# Patient Record
Sex: Male | Born: 1975 | Race: White | Hispanic: No | Marital: Married | Smoking: Never smoker
Health system: Southern US, Community
[De-identification: ages and names within clinical notes are randomized; demographics above are authoritative.]

## PROBLEM LIST (undated history)

## (undated) DIAGNOSIS — I319 Disease of pericardium, unspecified: Secondary | ICD-10-CM

---

## 2018-10-18 ENCOUNTER — Other Ambulatory Visit: Payer: Self-pay

## 2018-10-18 ENCOUNTER — Emergency Department
Admission: EM | Admit: 2018-10-18 | Discharge: 2018-10-18 | Disposition: A | Discharge status: Home / Self Care | Payer: BLUE CROSS/BLUE SHIELD | Source: Home / Self Care | Attending: Emergency Medicine | Admitting: Emergency Medicine

## 2018-10-18 ENCOUNTER — Emergency Department (INDEPENDENT_AMBULATORY_CARE_PROVIDER_SITE_OTHER): Payer: BLUE CROSS/BLUE SHIELD

## 2018-10-18 DIAGNOSIS — R0789 Other chest pain: Secondary | ICD-10-CM

## 2018-10-18 DIAGNOSIS — Z23 Encounter for immunization: Secondary | ICD-10-CM

## 2018-10-18 HISTORY — DX: Disease of pericardium, unspecified: I31.9

## 2018-10-18 MED ORDER — IBUPROFEN 200 MG PO TABS: ORAL_TABLET | ORAL | 0 refills | Status: AC

## 2018-10-18 MED ORDER — CYCLOBENZAPRINE HCL 10 MG PO TABS: 10.0000 mg | ORAL_TABLET | Freq: Every day | ORAL | 0 refills | Status: AC

## 2018-10-18 MED ORDER — INFLUENZA VAC SPLIT QUAD 0.5 ML IM SUSY
0.5000 mL | PREFILLED_SYRINGE | INTRAMUSCULAR | Status: AC
  Administered 2018-10-18: 0.5 mL via INTRAMUSCULAR

## 2018-10-18 NOTE — ED Provider Notes (Signed)
[facility]    CSN: [id number] Arrival date & time: 10/18/18  1301     History   Chief Complaint Chief Complaint  Patient presents with  . Chest Pain    HPI Marcus Norman is a 43 y.o. male.   HPI Recurrent left anterior chest discomfort, going on for the past 2 weeks, but might be months and he is not certain, so but he is not specific about duration. He does not rate as pain but stated "it's not pain is just something different and sore like from a heavy chest workout lifting weights at the gym".  After further questioning, he states pain is 1 out of 10, can increase to 2 out of 10. He has a PCP Dr. Nehemiah Settle at [facility], last seen there about 1 year ago.  Past medical history of pericarditis, diagnosed and treated when he was living in [place] in 2014.-He states that his symptoms were right anterior chest pain at that time. He states he was advised by his PCP that he might have residual referred pain from pericarditis.  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  For the past few days, he has had trouble sleeping due to discomfort in chest, 4 or 5 out of 10, which sometimes radiates to upper back.-When he runs, the pain tends to decrease. He has not taken any OTC meds. He states his last marathon was September 2019. Even in the winter months, he runs 5 miles 3 times a week, and he states there is no change in the left anterior just chest discomfort when he is at rest, during the run, or afterward. Associated symptoms: Denies any associated palpitations or shortness of breath.  No syncope or lightheadedness or seizures. Denies fever or chills or nausea or vomiting. Not associated with meals.  No associated epigastric pain.  No abdominal or flank pain or GU symptoms.  Past Medical History:  Diagnosis Date  . Pericarditis   Pericarditis 2014 when he was living in [place] Denies history of  diabetes.  Denies diagnosis of hypertension although occasionally BP is borderline elevated he states. There are no active problems to display for this patient.   History reviewed. No pertinent surgical history.    Home Medications -none    Family History History reviewed. No pertinent family history. Family history positive from one grandparent with diagnosis of CAD age 29.  Social History Social History   Tobacco Use  . Smoking status: Never Smoker  . Smokeless tobacco: Never Used  Substance Use Topics  . Alcohol use: Yes    Comment: occasional   . Drug use: Never   Denies ever using tobacco.  Does not use drugs.  Drinks alcohol occasionally, not to excess  Allergies   Penicillins   Review of Systems Review of Systems  All other systems reviewed and are negative.  Pertinent items noted in HPI and remainder of comprehensive ROS otherwise negative.   Physical Exam Triage Vital Signs ED Triage Vitals  Enc Vitals Group     BP      Pulse      Resp      Temp      Temp src      SpO2      Weight      Height      Head Circumference      Peak Flow      Pain Score  Pain Loc      Pain Edu?      Excl. in GC?    No data found.  Updated Vital Signs BP (!) 142/93 (BP Location: Right Arm)   Pulse 63   Temp 97.7 F (36.5 C) (Oral)   Resp 18   Ht 6' (1.829 m)   Wt 98.6 kg   SpO2 98%   BMI 29.48 kg/m    Physical Exam Vitals signs reviewed.  Constitutional:      General: He is not in acute distress.    Appearance: He is well-developed. He is not diaphoretic.  HENT:     Head: Normocephalic and atraumatic.  Eyes:     Extraocular Movements: Extraocular movements intact.     Pupils: Pupils are equal, round, and reactive to light.  Neck:     Musculoskeletal: Normal range of motion.     Comments: Nontender Cardiovascular:     Rate and Rhythm: Normal rate and regular rhythm.     Pulses:          Carotid pulses are 1+ on the right side and 1+ on the  left side.    Heart sounds: Normal heart sounds. No murmur. No friction rub. No gallop. No S3 or S4 sounds.   Pulmonary:     Effort: Pulmonary effort is normal. No respiratory distress.     Breath sounds: Normal breath sounds. No stridor. No decreased breath sounds, wheezing, rhonchi or rales.  Chest:     Chest [place]: Tenderness present.    Abdominal:     General: Abdomen is flat.     Palpations: Abdomen is soft.     Tenderness: There is no abdominal tenderness. There is no guarding.     Comments: No hepatosplenomegaly  Musculoskeletal:     Right shoulder: He exhibits normal range of motion, no tenderness and no bony tenderness.     Left shoulder: He exhibits normal range of motion, no tenderness and no bony tenderness.     Right lower leg: He exhibits no tenderness. No edema.     Left lower leg: He exhibits no tenderness. No edema.     Comments: Lower extremities: No cyanosis clubbing or edema or tenderness  Skin:    General: Skin is warm and dry.     Capillary Refill: Capillary refill takes less than 2 seconds.  Neurological:     General: No focal deficit present.     Mental Status: He is alert.     Cranial Nerves: Cranial nerves are intact. No cranial nerve deficit.     Sensory: Sensation is intact.     Motor: Motor function is intact.     Coordination: Coordination is intact.     Gait: Gait is intact.  Psychiatric:        Mood and Affect: Mood normal.        Behavior: Behavior normal.      UC Treatments / Results  Labs (all labs ordered are listed, but only abnormal results are displayed) Labs Reviewed - No data to display   EKG today: Rate 59.  Sinus rhythm.  Within normal limits.  No ischemic abnormality seen.  Radiology Dg Chest 2 View  Result Date: 10/18/2018 CLINICAL DATA:  Chest pain for few days. EXAM: CHEST - 2 VIEW COMPARISON:  None. FINDINGS: Cardiomediastinal silhouette is normal. Mediastinal contours appear intact. There is no evidence of focal airspace  consolidation, pleural effusion or pneumothorax. Osseous structures are without acute abnormality. Soft tissues are grossly normal.  IMPRESSION: No active cardiopulmonary disease. Electronically Signed   By: Ted Mcalpineobrinka  Dimitrova M.D.   On: 10/18/2018 15:03    Procedures Procedures (including critical care time)  Medications Ordered in UC Medications  Influenza vac split quadrivalent PF (FLUARIX) injection 0.5 mL (0.5 mLs Intramuscular Given 10/18/18 1538)    Initial Impression / Assessment and Plan / UC Course  I have reviewed the triage vital signs and the nursing notes.  Pertinent labs & imaging results that were available during my care of the patient were reviewed by me and considered in my medical decision making (see chart for details).    Reviewed with patient at length, explained EKG and chest x-ray today within normal limits.  No evidence of acute cardiorespiratory event  Final Clinical Impressions(s) / UC Diagnoses   Final diagnoses:  Chest pain, atypical  Atypical chest pain  Chest [place] pain     Discharge Instructions     Your EKG is normal today Chest x-ray today is normal. You likely have muscular chest [place] pain. Please read attached instruction sheet on chest [place] pain. Treatment is relative rest and staying away from heavy upper body exercising. Heat. OTC ibuprofen up to 600 mg 3 times a day with food. Printed prescription given for muscle relaxant, Flexeril, take 1 at bedtime for muscle relaxant. Today, please call Dr. Steva ColderPolite's's office to schedule a follow-up appointment in 1 week. If any severe worsening symptoms in the meantime, go to emergency room.    ED Prescriptions    Medication Sig Dispense Auth. Provider   ibuprofen (ADVIL,MOTRIN) 200 MG tablet Take three tablets ( 600 milligrams total) every 6 with food as needed for pain. 30 tablet Lajean ManesMassey, David, MD   cyclobenzaprine (FLEXERIL) 10 MG tablet Take 1 tablet (10 mg total) by mouth at bedtime. For  muscle relaxant 10 tablet Lajean ManesMassey, David, MD     Over 45 minutes spent, greater than 50% of the time spent for counseling and coordination of care.  At the end of visit, patient requested flu vaccine, so I ordered flu vaccine.  Nurse will administer flu vaccine now.   Lajean ManesMassey, David, MD 10/19/18 1536

## 2018-10-18 NOTE — ED Triage Notes (Addendum)
Pt presents to clinic with recurrent chest pain. He does not rate as pain but stated "it's not pain is just something different and sore like from a heavy chest workout". He has a PCP Dr. Nehemiah Settle and also has hx of pericarditis. Per PCP pt may have residual referred pain from pericarditis. 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 As of Saturday he had trouble sleeping due to discomfort in chest which sometimes radiates to upper back. He has not taken any OTC meds.

## 2018-10-18 NOTE — Discharge Instructions (Addendum)
Your EKG is normal today Chest x-ray today is normal. You likely have muscular chest [place] pain. Please read attached instruction sheet on chest [place] pain. Treatment is relative rest and staying away from heavy upper body exercising. Heat. OTC ibuprofen up to 600 mg 3 times a day with food. Printed prescription given for muscle relaxant, Flexeril, take 1 at bedtime for muscle relaxant. Today, please call Dr. Steva Colder office to schedule a follow-up appointment in 1 week. If any severe worsening symptoms in the meantime, go to emergency room.

## 2018-10-19 ENCOUNTER — Telehealth: Payer: Self-pay

## 2018-10-19 NOTE — Telephone Encounter (Signed)
Pt states he has improved since UC visit. Advised to call back with questions.

## 2019-09-01 IMAGING — DX DG CHEST 2V
2 series · 2 of 2 positions shown · non-contrast
Comparison: None.

CLINICAL DATA: Chest pain for few days.

EXAM:
CHEST - 2 VIEW

[chest pa]
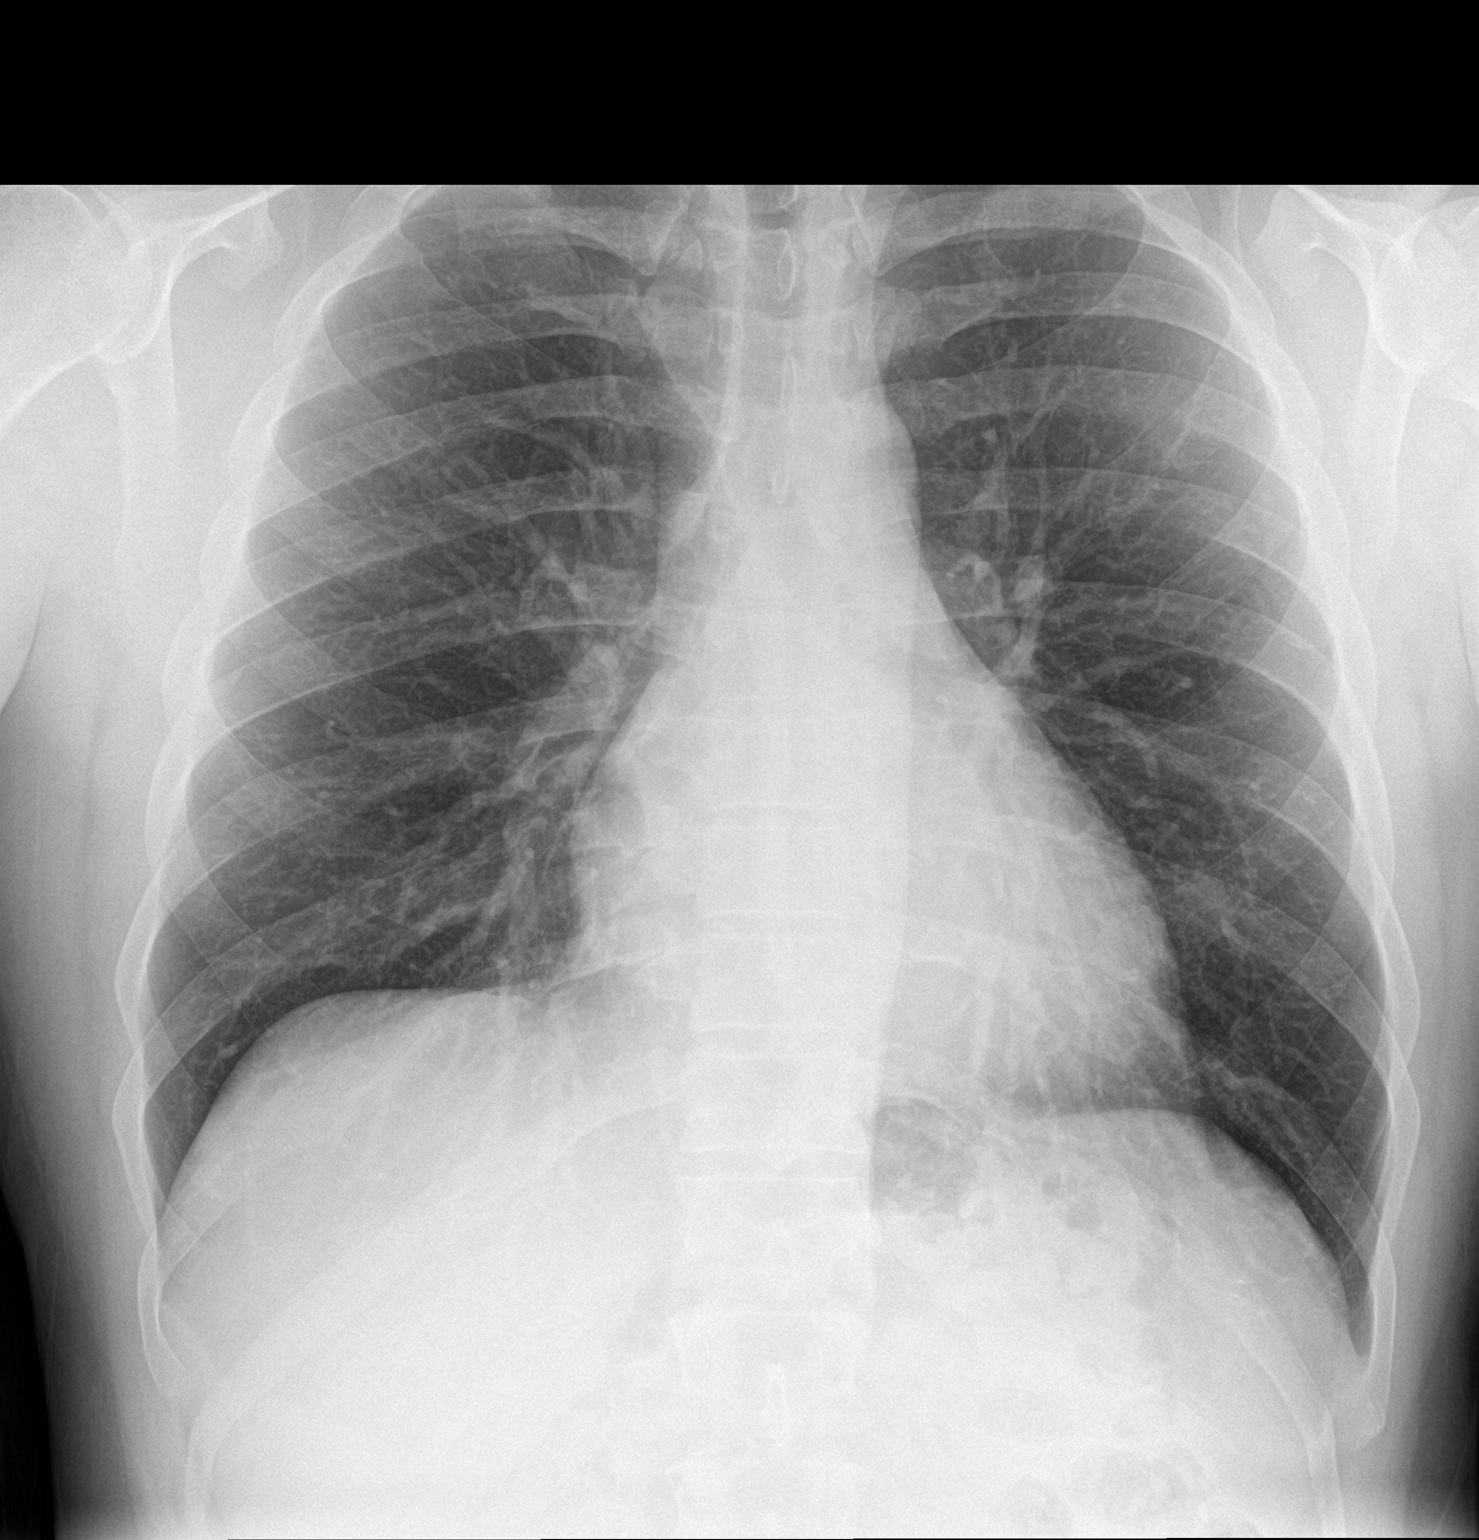

[chest lat]
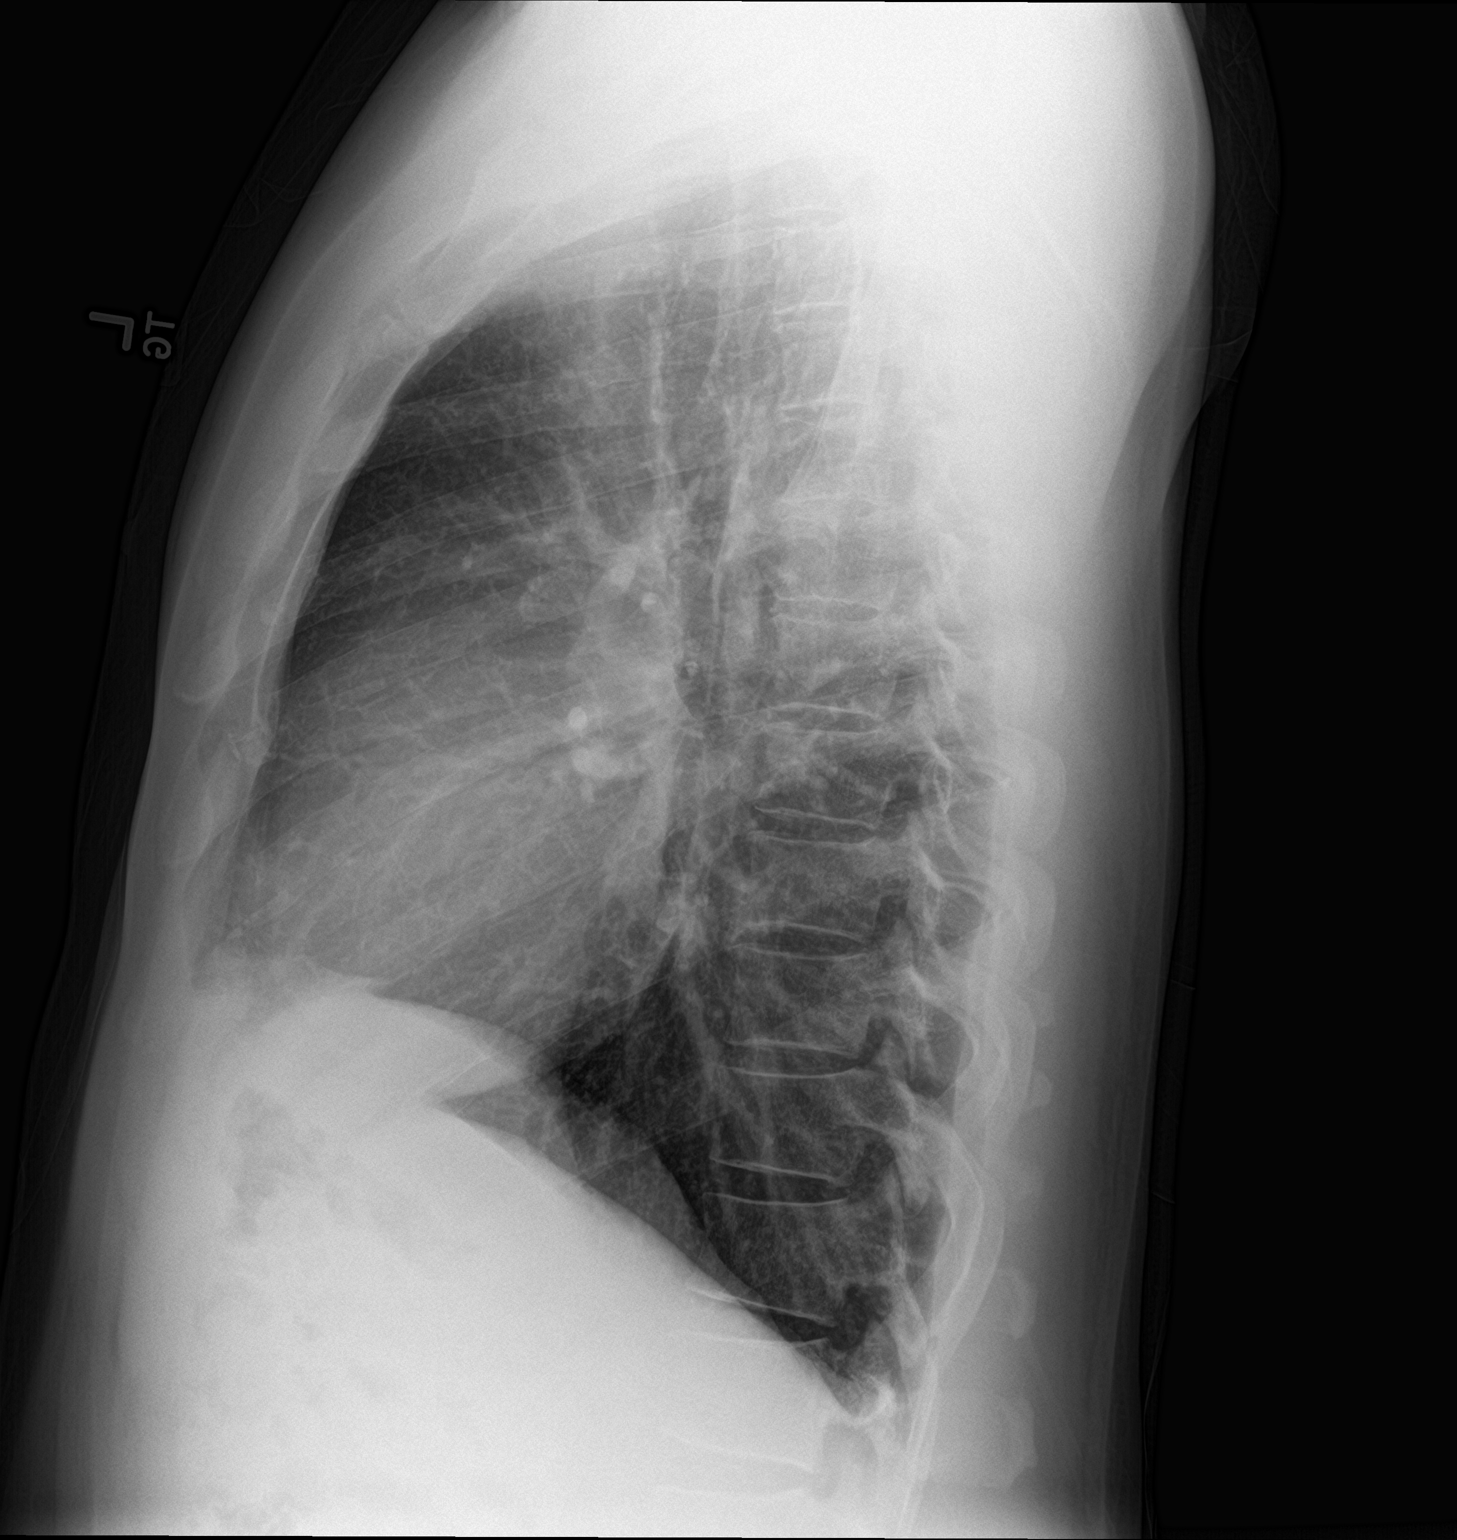

[2 of 2 positions shown; findings below may reference images not displayed]

FINDINGS: Cardiomediastinal silhouette is normal. Mediastinal contours appear
intact.

There is no evidence of focal airspace consolidation, pleural
effusion or pneumothorax.

Osseous structures are without acute abnormality. Soft tissues are
grossly normal.
IMPRESSION: No active cardiopulmonary disease.

## 2020-08-28 ENCOUNTER — Other Ambulatory Visit: Payer: Self-pay

## 2020-08-28 DIAGNOSIS — Z20822 Contact with and (suspected) exposure to covid-19: Secondary | ICD-10-CM

## 2020-08-30 LAB — SARS-COV-2, NAA 2 DAY TAT

## 2020-08-30 LAB — NOVEL CORONAVIRUS, NAA: SARS-CoV-2, NAA: NOT DETECTED

## 2021-10-25 DIAGNOSIS — M7522 Bicipital tendinitis, left shoulder: Secondary | ICD-10-CM | POA: Diagnosis not present

## 2021-10-25 DIAGNOSIS — R0789 Other chest pain: Secondary | ICD-10-CM | POA: Diagnosis not present

## 2021-10-25 DIAGNOSIS — R002 Palpitations: Secondary | ICD-10-CM | POA: Diagnosis not present

## 2022-01-24 DIAGNOSIS — R0981 Nasal congestion: Secondary | ICD-10-CM | POA: Diagnosis not present

## 2022-01-24 DIAGNOSIS — J029 Acute pharyngitis, unspecified: Secondary | ICD-10-CM | POA: Diagnosis not present

## 2022-08-19 ENCOUNTER — Ambulatory Visit
Admission: RE | Admit: 2022-08-19 | Discharge: 2022-08-19 | Disposition: A | Discharge status: Home / Self Care | Payer: 59 | Source: Ambulatory Visit | Attending: Physician Assistant | Admitting: Physician Assistant

## 2022-08-19 VITALS — BP 150/93 | HR 77 | Temp 98.5°F | Resp 20

## 2022-08-19 DIAGNOSIS — M25561 Pain in right knee: Secondary | ICD-10-CM | POA: Diagnosis not present

## 2022-08-19 DIAGNOSIS — S81811A Laceration without foreign body, right lower leg, initial encounter: Secondary | ICD-10-CM

## 2022-08-19 DIAGNOSIS — Z23 Encounter for immunization: Secondary | ICD-10-CM

## 2022-08-19 DIAGNOSIS — S86222A Laceration of muscle(s) and tendon(s) of anterior muscle group at lower leg level, left leg, initial encounter: Secondary | ICD-10-CM | POA: Diagnosis not present

## 2022-08-19 MED ORDER — TETANUS-DIPHTH-ACELL PERTUSSIS 5-2.5-18.5 LF-MCG/0.5 IM SUSY
0.5000 mL | PREFILLED_SYRINGE | Freq: Once | INTRAMUSCULAR | Status: AC
  Administered 2022-08-19: 0.5 mL via INTRAMUSCULAR

## 2022-08-19 NOTE — Discharge Instructions (Addendum)
Advised to keep the wound clean and dry.  Wash with soapy water lightly twice daily pat dry, then let air dry for about 30 minutes before applying another dressing. Stitches can be removed in 10 days. Advised ibuprofen or Motrin for pain relief. If the area starts becoming red, swollen, or any drainage or streaking that it needs to be checked at that time.

## 2022-08-19 NOTE — ED Triage Notes (Addendum)
Pt presents to uc with co of right leg laceration. Pt reports he is renovating his bathroom at home and he cut his leg on the wire supports. Pt reports he is not up to date on tetanus. Pt st this happened one hour ago and burns slightly, bleeding is under control laceration is located on medial side of right calf. About 2 inches in length.

## 2022-08-19 NOTE — ED Provider Notes (Signed)
[facility]    CSN: [id number] Arrival date & time: 08/19/22  1325      History   Chief Complaint Chief Complaint  Patient presents with   Laceration    HPI Marcus Norman is a 46 y.o. male.   46 year old male presents with right lower leg laceration.  Patient indicates that he was working in the bathroom remodeling when he had pulled some ceramic tile off the [place] and it slipped and lacerated his right lower leg.  Patient indicates that he has mild pain and discomfort associated.  He indicates he does not recall when his last tetanus was given.  Patient presents today for repair of the laceration on his leg.   Laceration   Past Medical History:  Diagnosis Date   Pericarditis     There are no problems to display for this patient.   History reviewed. No pertinent surgical history.     Home Medications    Prior to Admission medications   Medication Sig Start Date End Date Taking? Authorizing Provider  cyclobenzaprine (FLEXERIL) 10 MG tablet Take 1 tablet (10 mg total) by mouth at bedtime. For muscle relaxant 10/18/18   Lajean Manes, MD  ibuprofen (ADVIL,MOTRIN) 200 MG tablet Take three tablets ( 600 milligrams total) every 6 with food as needed for pain. 10/18/18   Lajean Manes, MD    Family History History reviewed. No pertinent family history.  Social History Social History   Tobacco Use   Smoking status: Never   Smokeless tobacco: Never  Vaping Use   Vaping Use: Never used  Substance Use Topics   Alcohol use: Yes    Comment: occasional    Drug use: Never     Allergies   Penicillins   Review of Systems Review of Systems  Skin:  Positive for wound (cut right lower leg).     Physical Exam Triage Vital Signs ED Triage Vitals  Enc Vitals Group     BP 08/19/22 1359 (!) 150/93     Pulse Rate 08/19/22 1359 77     Resp 08/19/22 1359 20     Temp 08/19/22 1359 98.5 F (36.9 C)     Temp Source 08/19/22 1359 Oral     SpO2 08/19/22  1359 98 %     Weight --      Height --      Head Circumference --      Peak Flow --      Pain Score 08/19/22 1402 2     Pain Loc --      Pain Edu? --      Excl. in GC? --    No data found.  Updated Vital Signs BP (!) 150/93 (BP Location: Left Arm)   Pulse 77   Temp 98.5 F (36.9 C) (Oral)   Resp 20   SpO2 98%   Visual Acuity Right Eye Distance:   Left Eye Distance:   Bilateral Distance:    Right Eye Near:   Left Eye Near:    Bilateral Near:     Physical Exam Constitutional:      Appearance: Normal appearance.  Skin:         Comments: Right lower leg: Medial mid aspect of the right lower leg there is a 3 cm laceration present that is vertical. Procedure: The area is cleaned with sterile water, anesthetized with lidocaine 4 cc injected into the wound.  The area was flushed with sterile water.  The laceration is repaired with 4-0  Ethilon, 5 sutures were used.  Good approximation was achieved.  No complications.  Neurological:     Mental Status: He is alert.      UC Treatments / Results  Labs (all labs ordered are listed, but only abnormal results are displayed) Labs Reviewed - No data to display  EKG   Radiology No results found.  Procedures Procedures (including critical care time)  Medications Ordered in UC Medications  Tdap (BOOSTRIX) injection 0.5 mL (0.5 mLs Intramuscular Given 08/19/22 1455)    Initial Impression / Assessment and Plan / UC Course  I have reviewed the triage vital signs and the nursing notes.  Pertinent labs & imaging results that were available during my care of the patient were reviewed by me and considered in my medical decision making (see chart for details).    Plan: 1.  The laceration of the right lower leg treated with: A.  Laceration repaired with 4-0 Ethilon, 5 sutures used. 2.  Pain of the right lower leg is treated with the following: A.  Patient advised take ibuprofen for pain relief as needed. 3.  Patient advised  to return in 10 days for suture removal. Final Clinical Impressions(s) / UC Diagnoses   Final diagnoses:  Arthralgia of right lower leg  Laceration of right lower leg, initial encounter     Discharge Instructions      Advised to keep the wound clean and dry.  Wash with soapy water lightly twice daily pat dry, then let air dry for about 30 minutes before applying another dressing. Stitches can be removed in 10 days. Advised ibuprofen or Motrin for pain relief. If the area starts becoming red, swollen, or any drainage or streaking that it needs to be checked at that time.     ED Prescriptions   None    PDMP not reviewed this encounter.   Nyoka Lint, PA-C 08/19/22 1500

## 2022-09-03 ENCOUNTER — Ambulatory Visit
Admission: EM | Admit: 2022-09-03 | Discharge: 2022-09-03 | Disposition: A | Discharge status: Home / Self Care | Payer: 59 | Attending: Physician Assistant | Admitting: Physician Assistant

## 2022-09-03 DIAGNOSIS — S81811D Laceration without foreign body, right lower leg, subsequent encounter: Secondary | ICD-10-CM

## 2022-09-03 DIAGNOSIS — Z4802 Encounter for removal of sutures: Secondary | ICD-10-CM

## 2022-09-03 DIAGNOSIS — L089 Local infection of the skin and subcutaneous tissue, unspecified: Secondary | ICD-10-CM

## 2022-09-03 MED ORDER — DOXYCYCLINE MONOHYDRATE 100 MG PO CAPS: 100.0000 mg | ORAL_CAPSULE | Freq: Two times a day (BID) | ORAL | 0 refills | Status: AC

## 2022-09-03 NOTE — ED Provider Notes (Addendum)
[facility]    CSN: [id number] Arrival date & time: 09/03/22  0805      History   Chief Complaint Chief Complaint  Patient presents with   Suture / Staple Removal    HPI Marcus Norman is a 46 y.o. male.   Patient presents today for suture removal.  Upon inspection of wound, mild concern for infection.  Patient has not had any fever.  He reports he has been cleaning wound with peroxide.  The history is provided by the patient.  Suture / Staple Removal    Past Medical History:  Diagnosis Date   Pericarditis     There are no problems to display for this patient.   History reviewed. No pertinent surgical history.     Home Medications    Prior to Admission medications   Medication Sig Start Date End Date Taking? Authorizing Provider  cyclobenzaprine (FLEXERIL) 10 MG tablet Take 1 tablet (10 mg total) by mouth at bedtime. For muscle relaxant 10/18/18   Lajean Manes, MD  ibuprofen (ADVIL,MOTRIN) 200 MG tablet Take three tablets ( 600 milligrams total) every 6 with food as needed for pain. 10/18/18   Lajean Manes, MD    Family History History reviewed. No pertinent family history.  Social History Social History   Tobacco Use   Smoking status: Never   Smokeless tobacco: Never  Vaping Use   Vaping Use: Never used  Substance Use Topics   Alcohol use: Yes    Comment: occasional    Drug use: Never     Allergies   Penicillins   Review of Systems Review of Systems  Constitutional:  Negative for chills and fever.  Eyes:  Negative for discharge and redness.  Skin:  Positive for color change and wound.  Neurological:  Negative for numbness.     Physical Exam Triage Vital Signs ED Triage Vitals [09/03/22 0859]  Enc Vitals Group     BP (!) 140/90     Pulse Rate 80     Resp 16     Temp 98 F (36.7 C)     Temp Source Oral     SpO2 98 %     Weight      Height      Head Circumference      Peak Flow      Pain Score 0     Pain Loc       Pain Edu?      Excl. in GC?    No data found.  Updated Vital Signs BP (!) 140/90 (BP Location: Left Arm)   Pulse 80   Temp 98 F (36.7 C) (Oral)   Resp 16   SpO2 98%      Physical Exam Vitals and nursing note reviewed.  Constitutional:      General: He is not in acute distress.    Appearance: Normal appearance. He is not ill-appearing.  HENT:     Head: Normocephalic and atraumatic.  Eyes:     Conjunctiva/sclera: Conjunctivae normal.  Cardiovascular:     Rate and Rhythm: Normal rate.  Pulmonary:     Effort: Pulmonary effort is normal.  Skin:    Comments: Wound to right lower leg with well approximated edge after sutures removed, mild surrounding erythema with minimal purulent drainage noted  Neurological:     Mental Status: He is alert.  Psychiatric:        Mood and Affect: Mood normal.        Behavior: Behavior  normal.        Thought Content: Thought content normal.      UC Treatments / Results  Labs (all labs ordered are listed, but only abnormal results are displayed) Labs Reviewed - No data to display  EKG   Radiology No results found.  Procedures Procedures (including critical care time)  Medications Ordered in UC Medications - No data to display  Initial Impression / Assessment and Plan / UC Course  I have reviewed the triage vital signs and the nursing notes.  Pertinent labs & imaging results that were available during my care of the patient were reviewed by me and considered in my medical decision making (see chart for details).    Will treat to cover infection with doxycycline.  Recommended cleaning area with soap and water only, avoiding further use of peroxide.  Encouraged follow-up if no gradual improvement or with any further concerns. Patient expresses understanding.   Final Clinical Impressions(s) / UC Diagnoses   Final diagnoses:  Post-traumatic wound infection  Laceration of right leg excluding thigh, subsequent encounter    Discharge Instructions   None    ED Prescriptions     Medication Sig Dispense Auth. Provider   doxycycline (MONODOX) 100 MG capsule Take 1 capsule (100 mg total) by mouth 2 (two) times daily for 10 days. 20 capsule Francene Finders, PA-C      PDMP not reviewed this encounter.   Francene Finders, PA-C 09/03/22 1239    Francene Finders, PA-C 09/19/22 [phone]

## 2022-09-03 NOTE — ED Triage Notes (Signed)
Pt initially came to clinic for rn visit suture removal. After removal, RN concerned for infection 2/2 redness and visible, minimal, purulent discharge. Provider at bedside assessed and treated so converted to a normal encounter.   6 stitches removed without complication.
